# Patient Record
Sex: Male | Born: 1975 | Race: White | Hispanic: No | Marital: Married | Smoking: Never smoker
Health system: Southern US, Community
[De-identification: ages and names within clinical notes are randomized; demographics above are authoritative.]

## PROBLEM LIST (undated history)

## (undated) DIAGNOSIS — I10 Essential (primary) hypertension: Secondary | ICD-10-CM

---

## 2019-05-08 ENCOUNTER — Emergency Department (HOSPITAL_COMMUNITY): Payer: No Typology Code available for payment source

## 2019-05-08 ENCOUNTER — Other Ambulatory Visit: Payer: Self-pay

## 2019-05-08 ENCOUNTER — Observation Stay (HOSPITAL_COMMUNITY)
Admission: EM | Admit: 2019-05-08 | Discharge: 2019-05-10 | Disposition: A | Discharge status: Home / Self Care | Payer: No Typology Code available for payment source | Attending: Orthopaedic Surgery | Admitting: Orthopaedic Surgery

## 2019-05-08 ENCOUNTER — Emergency Department (HOSPITAL_COMMUNITY): Payer: No Typology Code available for payment source | Admitting: Certified Registered"

## 2019-05-08 ENCOUNTER — Encounter (HOSPITAL_COMMUNITY): Payer: Self-pay

## 2019-05-08 ENCOUNTER — Encounter (HOSPITAL_COMMUNITY)
Admission: EM | Disposition: A | Discharge status: Home / Self Care | Payer: Self-pay | Source: Home / Self Care | Attending: Emergency Medicine

## 2019-05-08 DIAGNOSIS — Z419 Encounter for procedure for purposes other than remedying health state, unspecified: Secondary | ICD-10-CM

## 2019-05-08 DIAGNOSIS — M25572 Pain in left ankle and joints of left foot: Secondary | ICD-10-CM | POA: Insufficient documentation

## 2019-05-08 DIAGNOSIS — S82872A Displaced pilon fracture of left tibia, initial encounter for closed fracture: Secondary | ICD-10-CM | POA: Diagnosis not present

## 2019-05-08 DIAGNOSIS — Z20828 Contact with and (suspected) exposure to other viral communicable diseases: Secondary | ICD-10-CM | POA: Diagnosis not present

## 2019-05-08 DIAGNOSIS — W1789XA Other fall from one level to another, initial encounter: Secondary | ICD-10-CM | POA: Diagnosis not present

## 2019-05-08 DIAGNOSIS — I10 Essential (primary) hypertension: Secondary | ICD-10-CM | POA: Diagnosis not present

## 2019-05-08 HISTORY — PX: EXTERNAL FIXATION LEG: SHX1549

## 2019-05-08 HISTORY — DX: Essential (primary) hypertension: I10

## 2019-05-08 LAB — CBC WITH DIFFERENTIAL/PLATELET
Abs Immature Granulocytes: 0.07 10*3/uL (ref 0.00–0.07)
Basophils Absolute: 0 10*3/uL (ref 0.0–0.1)
Basophils Relative: 0 %
Eosinophils Absolute: 0 10*3/uL (ref 0.0–0.5)
Eosinophils Relative: 0 %
HCT: 47.2 % (ref 39.0–52.0)
Hemoglobin: 16.5 g/dL (ref 13.0–17.0)
Immature Granulocytes: 1 %
Lymphocytes Relative: 10 %
Lymphs Abs: 1.2 10*3/uL (ref 0.7–4.0)
MCH: 28.9 pg (ref 26.0–34.0)
MCHC: 35 g/dL (ref 30.0–36.0)
MCV: 82.7 fL (ref 80.0–100.0)
Monocytes Absolute: 0.8 10*3/uL (ref 0.1–1.0)
Monocytes Relative: 6 %
Neutro Abs: 10.1 10*3/uL — ABNORMAL HIGH (ref 1.7–7.7)
Neutrophils Relative %: 83 %
Platelets: 204 10*3/uL (ref 150–400)
RBC: 5.71 MIL/uL (ref 4.22–5.81)
RDW: 14.1 % (ref 11.5–15.5)
WBC: 12.2 10*3/uL — ABNORMAL HIGH (ref 4.0–10.5)
nRBC: 0 % (ref 0.0–0.2)

## 2019-05-08 LAB — BASIC METABOLIC PANEL
Anion gap: 13 (ref 5–15)
BUN: 20 mg/dL (ref 6–20)
CO2: 21 mmol/L — ABNORMAL LOW (ref 22–32)
Calcium: 8.7 mg/dL — ABNORMAL LOW (ref 8.9–10.3)
Chloride: 104 mmol/L (ref 98–111)
Creatinine, Ser: 0.91 mg/dL (ref 0.61–1.24)
GFR calc Af Amer: >60 mL/min (ref >60)
GFR calc non Af Amer: >60 mL/min (ref >60)
Glucose, Bld: 112 mg/dL — ABNORMAL HIGH (ref 70–99)
Potassium: 3.5 mmol/L (ref 3.5–5.1)
Sodium: 138 mmol/L (ref 135–145)

## 2019-05-08 LAB — SARS CORONAVIRUS 2 BY RT PCR (HOSPITAL ORDER, PERFORMED IN ~~LOC~~ HOSPITAL LAB): SARS Coronavirus 2: NEGATIVE

## 2019-05-08 SURGERY — EXTERNAL FIXATION, LOWER EXTREMITY
Anesthesia: General | Site: Leg Lower | Laterality: Left

## 2019-05-08 MED ORDER — FENTANYL CITRATE (PF) 100 MCG/2ML IJ SOLN
INTRAMUSCULAR | Status: AC
  Filled 2019-05-08: qty 2

## 2019-05-08 MED ORDER — METOCLOPRAMIDE HCL 5 MG PO TABS: 5.0000 mg | ORAL_TABLET | Freq: Three times a day (TID) | ORAL | Status: DC | PRN

## 2019-05-08 MED ORDER — FENTANYL CITRATE (PF) 100 MCG/2ML IJ SOLN
25.0000 ug | INTRAMUSCULAR | Status: DC | PRN
  Administered 2019-05-08 (×2): 50 ug via INTRAVENOUS
  Administered 2019-05-08 (×2): 25 ug via INTRAVENOUS

## 2019-05-08 MED ORDER — MORPHINE SULFATE (PF) 4 MG/ML IV SOLN
6.0000 mg | Freq: Once | INTRAVENOUS | Status: AC
  Administered 2019-05-08: 6 mg via INTRAVENOUS
  Filled 2019-05-08: qty 2

## 2019-05-08 MED ORDER — ONDANSETRON HCL 4 MG/2ML IJ SOLN
INTRAMUSCULAR | Status: DC | PRN
  Administered 2019-05-08: 4 mg via INTRAVENOUS

## 2019-05-08 MED ORDER — ACETAMINOPHEN 325 MG PO TABS
325.0000 mg | ORAL_TABLET | Freq: Four times a day (QID) | ORAL | Status: DC | PRN
  Administered 2019-05-09: 650 mg via ORAL
  Filled 2019-05-08: qty 2

## 2019-05-08 MED ORDER — SUGAMMADEX SODIUM 500 MG/5ML IV SOLN
INTRAVENOUS | Status: DC | PRN
  Administered 2019-05-08: 250 mg via INTRAVENOUS

## 2019-05-08 MED ORDER — PANTOPRAZOLE SODIUM 40 MG PO TBEC
40.0000 mg | DELAYED_RELEASE_TABLET | Freq: Every day | ORAL | Status: DC
  Administered 2019-05-09 – 2019-05-10 (×2): 40 mg via ORAL
  Filled 2019-05-08 (×2): qty 1

## 2019-05-08 MED ORDER — CLINDAMYCIN PHOSPHATE 900 MG/50ML IV SOLN
INTRAVENOUS | Status: AC
  Filled 2019-05-08: qty 50

## 2019-05-08 MED ORDER — LACTATED RINGERS IV SOLN
INTRAVENOUS | Status: DC | PRN
  Administered 2019-05-08: 20:00:00 via INTRAVENOUS

## 2019-05-08 MED ORDER — LABETALOL HCL 5 MG/ML IV SOLN
INTRAVENOUS | Status: DC | PRN
  Administered 2019-05-08 (×2): 5 mg via INTRAVENOUS

## 2019-05-08 MED ORDER — FENTANYL CITRATE (PF) 100 MCG/2ML IJ SOLN
50.0000 ug | INTRAMUSCULAR | Status: DC | PRN
  Administered 2019-05-08: 23:00:00 50 ug via INTRAVENOUS

## 2019-05-08 MED ORDER — FENTANYL CITRATE (PF) 100 MCG/2ML IJ SOLN
100.0000 ug | Freq: Once | INTRAMUSCULAR | Status: AC
  Administered 2019-05-08: 100 ug via INTRAVENOUS
  Filled 2019-05-08: qty 2

## 2019-05-08 MED ORDER — SUCCINYLCHOLINE CHLORIDE 200 MG/10ML IV SOSY
PREFILLED_SYRINGE | INTRAVENOUS | Status: DC | PRN
  Administered 2019-05-08: 140 mg via INTRAVENOUS

## 2019-05-08 MED ORDER — KETOROLAC TROMETHAMINE 15 MG/ML IJ SOLN
15.0000 mg | Freq: Four times a day (QID) | INTRAMUSCULAR | Status: AC
  Administered 2019-05-08 – 2019-05-09 (×4): 15 mg via INTRAVENOUS
  Filled 2019-05-08 (×4): qty 1

## 2019-05-08 MED ORDER — METOCLOPRAMIDE HCL 5 MG/ML IJ SOLN
5.0000 mg | Freq: Three times a day (TID) | INTRAMUSCULAR | Status: DC | PRN
  Administered 2019-05-09: 5 mg via INTRAVENOUS
  Filled 2019-05-08 (×2): qty 2

## 2019-05-08 MED ORDER — ROCURONIUM BROMIDE 10 MG/ML (PF) SYRINGE
PREFILLED_SYRINGE | INTRAVENOUS | Status: DC | PRN
  Administered 2019-05-08: 5 mg via INTRAVENOUS
  Administered 2019-05-08: 45 mg via INTRAVENOUS
  Administered 2019-05-08: 10 mg via INTRAVENOUS

## 2019-05-08 MED ORDER — OXYCODONE HCL 5 MG PO TABS
5.0000 mg | ORAL_TABLET | ORAL | Status: DC | PRN
  Administered 2019-05-09 – 2019-05-10 (×4): 10 mg via ORAL
  Filled 2019-05-08 (×2): qty 2
  Filled 2019-05-08: qty 1
  Filled 2019-05-08 (×2): qty 2

## 2019-05-08 MED ORDER — ONDANSETRON HCL 4 MG PO TABS
4.0000 mg | ORAL_TABLET | Freq: Four times a day (QID) | ORAL | Status: DC | PRN
  Administered 2019-05-10: 13:00:00 4 mg via ORAL
  Filled 2019-05-08: qty 1

## 2019-05-08 MED ORDER — HYDRALAZINE HCL 20 MG/ML IJ SOLN
INTRAMUSCULAR | Status: AC
  Filled 2019-05-08: qty 1

## 2019-05-08 MED ORDER — METHOCARBAMOL 500 MG PO TABS
500.0000 mg | ORAL_TABLET | Freq: Four times a day (QID) | ORAL | Status: DC | PRN
  Administered 2019-05-09 – 2019-05-10 (×4): 500 mg via ORAL
  Filled 2019-05-08 (×4): qty 1

## 2019-05-08 MED ORDER — ONDANSETRON HCL 4 MG/2ML IJ SOLN
4.0000 mg | Freq: Four times a day (QID) | INTRAMUSCULAR | Status: DC | PRN
  Filled 2019-05-08: qty 2

## 2019-05-08 MED ORDER — FENTANYL CITRATE (PF) 100 MCG/2ML IJ SOLN
INTRAMUSCULAR | Status: DC | PRN
  Administered 2019-05-08: 50 ug via INTRAVENOUS
  Administered 2019-05-08 (×2): 100 ug via INTRAVENOUS

## 2019-05-08 MED ORDER — OXYCODONE HCL 5 MG PO TABS
10.0000 mg | ORAL_TABLET | ORAL | Status: DC | PRN
  Administered 2019-05-09 (×4): 10 mg via ORAL
  Administered 2019-05-10: 15 mg via ORAL
  Filled 2019-05-08 (×3): qty 2
  Filled 2019-05-08 (×2): qty 3

## 2019-05-08 MED ORDER — ONDANSETRON HCL 4 MG/2ML IJ SOLN
4.0000 mg | Freq: Once | INTRAMUSCULAR | Status: AC | PRN
  Administered 2019-05-08: 21:00:00 4 mg via INTRAVENOUS

## 2019-05-08 MED ORDER — DEXAMETHASONE SODIUM PHOSPHATE 10 MG/ML IJ SOLN
INTRAMUSCULAR | Status: DC | PRN
  Administered 2019-05-08: 10 mg via INTRAVENOUS

## 2019-05-08 MED ORDER — ONDANSETRON HCL 4 MG/2ML IJ SOLN
INTRAMUSCULAR | Status: AC
  Filled 2019-05-08: qty 2

## 2019-05-08 MED ORDER — SUCCINYLCHOLINE CHLORIDE 200 MG/10ML IV SOSY
PREFILLED_SYRINGE | INTRAVENOUS | Status: AC
  Filled 2019-05-08: qty 10

## 2019-05-08 MED ORDER — MIDAZOLAM HCL 2 MG/2ML IJ SOLN
INTRAMUSCULAR | Status: AC
  Filled 2019-05-08: qty 2

## 2019-05-08 MED ORDER — MORPHINE SULFATE (PF) 4 MG/ML IV SOLN
4.0000 mg | INTRAVENOUS | Status: DC | PRN
  Administered 2019-05-08 – 2019-05-10 (×7): 4 mg via INTRAVENOUS
  Filled 2019-05-08 (×8): qty 1

## 2019-05-08 MED ORDER — HYDRALAZINE HCL 20 MG/ML IJ SOLN
10.0000 mg | Freq: Four times a day (QID) | INTRAMUSCULAR | Status: DC | PRN
  Administered 2019-05-08: 10 mg via INTRAVENOUS

## 2019-05-08 MED ORDER — DEXAMETHASONE SODIUM PHOSPHATE 10 MG/ML IJ SOLN
INTRAMUSCULAR | Status: AC
  Filled 2019-05-08: qty 1

## 2019-05-08 MED ORDER — PROPOFOL 10 MG/ML IV BOLUS
INTRAVENOUS | Status: AC
  Filled 2019-05-08: qty 20

## 2019-05-08 MED ORDER — ROCURONIUM BROMIDE 10 MG/ML (PF) SYRINGE
PREFILLED_SYRINGE | INTRAVENOUS | Status: AC
  Filled 2019-05-08: qty 10

## 2019-05-08 MED ORDER — METHOCARBAMOL 500 MG IVPB - SIMPLE MED
500.0000 mg | Freq: Four times a day (QID) | INTRAVENOUS | Status: DC | PRN
  Administered 2019-05-08: 500 mg via INTRAVENOUS
  Filled 2019-05-08: qty 50

## 2019-05-08 MED ORDER — SODIUM CHLORIDE 0.9 % IV SOLN
INTRAVENOUS | Status: DC
  Administered 2019-05-08 – 2019-05-09 (×2): via INTRAVENOUS

## 2019-05-08 MED ORDER — DOCUSATE SODIUM 100 MG PO CAPS
100.0000 mg | ORAL_CAPSULE | Freq: Two times a day (BID) | ORAL | Status: DC
  Administered 2019-05-08 – 2019-05-10 (×4): 100 mg via ORAL
  Filled 2019-05-08 (×4): qty 1

## 2019-05-08 MED ORDER — PROPOFOL 10 MG/ML IV BOLUS
INTRAVENOUS | Status: DC | PRN
  Administered 2019-05-08: 200 mg via INTRAVENOUS

## 2019-05-08 MED ORDER — POLYETHYLENE GLYCOL 3350 17 G PO PACK: 17.0000 g | PACK | Freq: Every day | ORAL | Status: DC | PRN

## 2019-05-08 MED ORDER — FENTANYL CITRATE (PF) 250 MCG/5ML IJ SOLN
INTRAMUSCULAR | Status: AC
  Filled 2019-05-08: qty 5

## 2019-05-08 MED ORDER — MIDAZOLAM HCL 5 MG/5ML IJ SOLN
INTRAMUSCULAR | Status: DC | PRN
  Administered 2019-05-08: 2 mg via INTRAVENOUS

## 2019-05-08 MED ORDER — METHOCARBAMOL 500 MG IVPB - SIMPLE MED
INTRAVENOUS | Status: AC
  Filled 2019-05-08: qty 50

## 2019-05-08 MED ORDER — BUPIVACAINE HCL (PF) 0.5 % IJ SOLN
INTRAMUSCULAR | Status: AC
  Filled 2019-05-08: qty 30

## 2019-05-08 MED ORDER — CLINDAMYCIN PHOSPHATE 900 MG/50ML IV SOLN
INTRAVENOUS | Status: DC | PRN
  Administered 2019-05-08: 900 mg via INTRAVENOUS

## 2019-05-08 MED ORDER — GABAPENTIN 100 MG PO CAPS
100.0000 mg | ORAL_CAPSULE | Freq: Three times a day (TID) | ORAL | Status: DC
  Administered 2019-05-08 – 2019-05-10 (×5): 100 mg via ORAL
  Filled 2019-05-08 (×5): qty 1

## 2019-05-08 MED ORDER — SUGAMMADEX SODIUM 500 MG/5ML IV SOLN
INTRAVENOUS | Status: AC
  Filled 2019-05-08: qty 5

## 2019-05-08 MED ORDER — DIPHENHYDRAMINE HCL 12.5 MG/5ML PO ELIX
12.5000 mg | ORAL_SOLUTION | ORAL | Status: DC | PRN
  Administered 2019-05-09 (×2): 12.5 mg via ORAL
  Administered 2019-05-09: 25 mg via ORAL
  Filled 2019-05-08 (×2): qty 5
  Filled 2019-05-08: qty 10

## 2019-05-08 MED ORDER — SODIUM CHLORIDE 0.9 % IV SOLN
INTRAVENOUS | Status: AC
  Filled 2019-05-08: qty 500000

## 2019-05-08 MED ORDER — LIDOCAINE 2% (20 MG/ML) 5 ML SYRINGE
INTRAMUSCULAR | Status: DC | PRN
  Administered 2019-05-08: 100 mg via INTRAVENOUS

## 2019-05-08 SURGICAL SUPPLY — 37 items
BAG ZIPLOCK 12X15 (MISCELLANEOUS) ×3 IMPLANT
BANDAGE ESMARK 6X9 LF (GAUZE/BANDAGES/DRESSINGS) ×1 IMPLANT
BAR GLASS FIBER EXFX 11X400 (EXFIX) ×6 IMPLANT
BNDG ELASTIC 4X5.8 VLCR STR LF (GAUZE/BANDAGES/DRESSINGS) ×6 IMPLANT
BNDG ESMARK 6X9 LF (GAUZE/BANDAGES/DRESSINGS) ×3
BNDG GAUZE ELAST 4 BULKY (GAUZE/BANDAGES/DRESSINGS) ×3 IMPLANT
CLAMP BLUE BAR TO PIN (EXFIX) ×6 IMPLANT
COVER SURGICAL LIGHT HANDLE (MISCELLANEOUS) ×3 IMPLANT
COVER WAND RF STERILE (DRAPES) IMPLANT
CUFF TOURN SGL QUICK 18X4 (TOURNIQUET CUFF) IMPLANT
CUFF TOURN SGL QUICK 24 (TOURNIQUET CUFF)
CUFF TOURN SGL QUICK 34 (TOURNIQUET CUFF)
CUFF TRNQT CYL 24X4X16.5-23 (TOURNIQUET CUFF) IMPLANT
CUFF TRNQT CYL 34X4.125X (TOURNIQUET CUFF) IMPLANT
DRAPE C-ARM 42X120 X-RAY (DRAPES) ×3 IMPLANT
DRAPE C-ARMOR (DRAPES) ×3 IMPLANT
DRSG CURAD 3X16 NADH (PACKING) ×3 IMPLANT
DRSG PAD ABDOMINAL 8X10 ST (GAUZE/BANDAGES/DRESSINGS) ×6 IMPLANT
DURAPREP 26ML APPLICATOR (WOUND CARE) ×3 IMPLANT
ELECT REM PT RETURN 15FT ADLT (MISCELLANEOUS) ×3 IMPLANT
GAUZE SPONGE 4X4 12PLY STRL (GAUZE/BANDAGES/DRESSINGS) ×3 IMPLANT
GLOVE BIO SURGEON STRL SZ7.5 (GLOVE) ×3 IMPLANT
GLOVE BIOGEL PI IND STRL 8 (GLOVE) ×1 IMPLANT
GLOVE BIOGEL PI INDICATOR 8 (GLOVE) ×2
GLOVE ECLIPSE 8.0 STRL XLNG CF (GLOVE) ×3 IMPLANT
GOWN STRL REUS W/TWL XL LVL3 (GOWN DISPOSABLE) ×3 IMPLANT
HALF PIN 5.0X160 (EXFIX) ×6 IMPLANT
KIT BASIN OR (CUSTOM PROCEDURE TRAY) ×3 IMPLANT
KIT TURNOVER KIT A (KITS) IMPLANT
PACK ORTHO EXTREMITY (CUSTOM PROCEDURE TRAY) ×3 IMPLANT
PAD CAST 4YDX4 CTTN HI CHSV (CAST SUPPLIES) ×1 IMPLANT
PADDING CAST COTTON 4X4 STRL (CAST SUPPLIES) ×2
PIN CLAMP 2BAR 75MM BLUE (EXFIX) ×3 IMPLANT
PIN TRANSFIXING 5.0 (EXFIX) ×3 IMPLANT
PROTECTOR NERVE ULNAR (MISCELLANEOUS) ×3 IMPLANT
SYR CONTROL 10ML LL (SYRINGE) ×3 IMPLANT
TOWEL OR 17X26 10 PK STRL BLUE (TOWEL DISPOSABLE) ×6 IMPLANT

## 2019-05-08 NOTE — H&P (Signed)
Jason Russo is an 43 y.o. male.   Chief Complaint: Left ankle pilon fracture Taking surgery later on today job and lives all the way over and wait for his neurology and worried about being all the way of hearing having surgery but I explained to him that definitive surgery can be done over there is night in the hospital and that he has over there HPI: Patient is a 43 year old male who was at work today time down the road [place] on the back of [employer] flatbed truck when he lost his footing and fell off injuring his left ankle.  He denies any loss of consciousness dizziness.  He did come down on his right heel 2.  But his main complaint today is the left ankle.  Is unable to bear weight on the ankle.  His brother was in our ER where he was found to have a left ankle pilon fracture.  Past Medical History:  Diagnosis Date  . Hypertension     History reviewed. No pertinent surgical history.  No family history on file. Social History:  reports that he has never smoked. His smokeless tobacco use includes chew. He reports current alcohol use. He reports that he does not use drugs.  Allergies:  Allergies  Allergen Reactions  . Hydromorphone Anaphylaxis  . Cefdinir     Other reaction(s): Other, Other (See Comments) Other Reaction: ARTHRALGIA Other Reaction: ARTHRALGIA   . Dilaudid [Hydromorphone Hcl] Other (See Comments)    Low HR  . Sertraline Hcl     Other reaction(s): Other Suicidal thoughts    (Not in a hospital admission)   Results for orders placed or performed during the hospital encounter of 05/08/19 (from the past 48 hour(s))  CBC with Differential/Platelet     Status: Abnormal   Collection Time: 05/08/19  4:23 PM  Result Value Ref Range   WBC 12.2 (H) 4.0 - 10.5 K/uL   RBC 5.71 4.22 - 5.81 MIL/uL   Hemoglobin 16.5 13.0 - 17.0 g/dL   HCT 47.2 39.0 - 52.0 %   MCV 82.7 80.0 - 100.0 fL   MCH 28.9 26.0 - 34.0 pg   MCHC 35.0 30.0 - 36.0 g/dL   RDW 14.1 11.5 - 15.5 %   Platelets 204 150 - 400 K/uL   nRBC 0.0 0.0 - 0.2 %   Neutrophils Relative % 83 %   Neutro Abs 10.1 (H) 1.7 - 7.7 K/uL   Lymphocytes Relative 10 %   Lymphs Abs 1.2 0.7 - 4.0 K/uL   Monocytes Relative 6 %   Monocytes Absolute 0.8 0.1 - 1.0 K/uL   Eosinophils Relative 0 %   Eosinophils Absolute 0.0 0.0 - 0.5 K/uL   Basophils Relative 0 %   Basophils Absolute 0.0 0.0 - 0.1 K/uL   Immature Granulocytes 1 %   Abs Immature Granulocytes 0.07 0.00 - 0.07 K/uL    Comment: Performed at [facility], [place] [address] [place], [place] [address]  Basic metabolic panel     Status: Abnormal   Collection Time: 05/08/19  4:23 PM  Result Value Ref Range   Sodium 138 135 - 145 mmol/L   Potassium 3.5 3.5 - 5.1 mmol/L   Chloride 104 98 - 111 mmol/L   CO2 21 (L) 22 - 32 mmol/L   Glucose, Bld 112 (H) 70 - 99 mg/dL   BUN 20 6 - 20 mg/dL   Creatinine, Ser 0.91 0.61 - 1.24 mg/dL   Calcium 8.7 (L) 8.9 -  10.3 mg/dL   GFR calc non Af Amer >60 >60 mL/min   GFR calc Af Amer >60 >60 mL/min   Anion gap 13 5 - 15    Comment: Performed at [facility], [address], [place], [place] [address]   Dg Tibia/fibula Left  Result Date: 05/08/2019 CLINICAL DATA:  Fall. Left ankle and foot pain and swelling. EXAM: LEFT TIBIA AND FIBULA - 2 VIEW COMPARISON:  None. FINDINGS: There is a comminuted, mildly displaced fracture of the distal tibia extending from the distal tibia shaft to the tibial plafond and through the base of the medial malleolus. No fracture is identified involving the more proximal tibia or fibula. The knee is located. There is diffuse soft tissue swelling about the ankle. IMPRESSION: Comminuted, intra-articular distal tibia fracture. Electronically Signed   By: Sebastian Ache M.D.   On: 05/08/2019 15:41   Dg Ankle Complete Left  Result Date: 05/08/2019 CLINICAL DATA:  Fall. Left ankle and foot pain and swelling. EXAM: LEFT ANKLE COMPLETE - 3+ VIEW COMPARISON:  None.  FINDINGS: There is a comminuted, mildly displaced fracture of the distal tibia extending from the distal tibia shaft to the tibial plafond and through the base of the medial malleolus. There is widening of the ankle joint laterally. The distal fibula appears intact. There is diffuse soft tissue swelling about the ankle. IMPRESSION: Comminuted, intra-articular distal tibia fracture with widening of the ankle joint. Electronically Signed   By: Sebastian Ache M.D.   On: 05/08/2019 15:40   Dg Foot Complete Left  Result Date: 05/08/2019 CLINICAL DATA:  Fall. Left ankle and foot pain and swelling. EXAM: LEFT FOOT - COMPLETE 3+ VIEW COMPARISON:  None. FINDINGS: No acute fracture or dislocation is identified in the foot. The distal tibia fracture is reported separately. There is a moderate-sized posterior calcaneal enthesophyte. IMPRESSION: No acute osseous abnormality in the foot. Electronically Signed   By: Sebastian Ache M.D.   On: 05/08/2019 15:37    Review of Systems  Constitutional: Negative for chills and fever.  Respiratory: Negative for shortness of breath.   Cardiovascular: Negative for chest pain.  Neurological: Negative for dizziness and loss of consciousness.  Lower extremities good range of motion bilateral hips without pain.  Right thigh lower leg nontender.  Right heel minimal tenderness.  Compartments both calves are soft and nontender bilaterally.  Edema involving the left ankle with tenderness over the medial malleolus obvious deformity.  Flexion extension bilateral toes intact.  Sensation intact bilateral feet to light touch.  Blood pressure (!) 172/109, temperature 97.6 F (36.4 C), temperature source Oral, resp. rate 20, SpO2 100 %. Physical Exam  Constitutional: He is oriented to person, place, and time. He appears well-developed and well-nourished. No distress.  HENT:  Head: Normocephalic and atraumatic.  Eyes: EOM are normal.  Cardiovascular: Intact distal pulses.  Respiratory:  Effort normal. No respiratory distress.  Neurological: He is alert and oriented to person, place, and time.  Skin: Skin is warm and dry.  Psychiatric: He has a normal mood and affect.     Assessment/Plan Left ankle pilon fracture: Reviewed the films with the patient discussing with him that he needs to have surgery to stabilize the fracture and bring it out to length.  However he would also require second definitive surgery after undergoing the initial surgery which would be a close reduction with external fixation of the left Pilon fracture.  Risk benefits of surgery discussed with patient at length.   Plan  to proceed with closed reduction left Pilon fracture with placement of an external fixator later today.  Tobacco cessation discussed with patient.  NPO  Nonweightbearing left lower extremity.   Richardean CanalGILBERT Jesselle Laflamme, PA-C 05/08/2019, 5:14 PM

## 2019-05-08 NOTE — ED Triage Notes (Signed)
Pt arrived via EMS from work , pt was standing on flat bed, and step backwards and fell backwards landing on his feet and now presents with Left foot pain no LOC, no head injuries, with obvious deformities, and swelling. Pt was given 200 mcg of fentanyl , 4 mg of Zofran enroute. Pt has 20 G left hand .

## 2019-05-08 NOTE — ED Notes (Signed)
Patient is upset due to wife not being able to come back to visit-patient is preparing for surgery and needs to be tested for Covid-attempted to explained to patient she could not come back until procedure was completed-patient would not allow writer to explain and proceeded to call me "rude"-relayed episode to primary RN

## 2019-05-08 NOTE — Brief Op Note (Signed)
05/08/2019  8:39 PM  PATIENT:  Jason Russo  43 y.o. male  PRE-OPERATIVE DIAGNOSIS:  LEFT ANKLE PILON FRACTURE  POST-OPERATIVE DIAGNOSIS:  LEFT ANKLE PILON FRACTURE  PROCEDURE:  Procedure(s): EXTERNAL FIXATION LEG (Left)  SURGEON:  Surgeon(s) and Role:    Mcarthur Rossetti, MD - Primary  PHYSICIAN ASSISTANT:  Benita Stabile, PA-C  ANESTHESIA:   general   EBL:  minimal  COUNTS:  YES  TOURNIQUET:  * No tourniquets in log *  DICTATION: .Other Dictation: Dictation Number [id number]  PLAN OF CARE: Admit for overnight observation  PATIENT DISPOSITION:  PACU - hemodynamically stable.   Delay start of Pharmacological VTE agent (>24hrs) due to surgical blood loss or risk of bleeding: no

## 2019-05-08 NOTE — Transfer of Care (Signed)
Immediate Anesthesia Transfer of Care Note  Patient: Jason Russo  Procedure(s) Performed: EXTERNAL FIXATION LEG (Left Leg Lower)  Patient Location: PACU  Anesthesia Type:General  Level of Consciousness: awake, alert  and oriented  Airway & Oxygen Therapy: Patient Spontanous Breathing and Patient connected to face mask oxygen  Post-op Assessment: Report given to RN and Post -op Vital signs reviewed and stable  Post vital signs: Reviewed and stable  Last Vitals:  Vitals Value Taken Time  BP 175/99 05/08/19 2047  Temp    Pulse 76 05/08/19 2049  Resp 20 05/08/19 2049  SpO2 100 % 05/08/19 2049  Vitals shown include unvalidated device data.  Last Pain:  Vitals:   05/08/19 1531  TempSrc:   PainSc: 8       Patients Stated Pain Goal: 2 (19/41/74 0814)  Complications: No apparent anesthesia complications

## 2019-05-08 NOTE — Anesthesia Procedure Notes (Addendum)
Procedure Name: Intubation Date/Time: 05/08/2019 7:51 PM Performed by: Keela Rubert D, CRNA Pre-anesthesia Checklist: Patient identified, Emergency Drugs available, Suction available and Patient being monitored Patient Re-evaluated:Patient Re-evaluated prior to induction Oxygen Delivery Method: Circle system utilized Preoxygenation: Pre-oxygenation with 100% oxygen Induction Type: IV induction Ventilation: Mask ventilation without difficulty Laryngoscope Size: Mac and 4 Grade View: Grade I Tube type: Oral Tube size: 7.5 mm Number of attempts: 1 Airway Equipment and Method: Stylet and Oral airway Placement Confirmation: ETT inserted through vocal cords under direct vision,  positive ETCO2 and breath sounds checked- equal and bilateral Secured at: 22 cm Tube secured with: Tape Dental Injury: Teeth and Oropharynx as per pre-operative assessment

## 2019-05-08 NOTE — ED Notes (Signed)
Pt refused COVID swab. Pt states he is 2 hours away from home and prefers to be at a hospital closer to home if he is going to have surgery.

## 2019-05-08 NOTE — ED Provider Notes (Signed)
[place] DEPT Provider Note   CSN: [id number] Arrival date & time: 05/08/19  1332     History   Chief Complaint Chief Complaint  Patient presents with  . Fall  . Ankle Pain    HPI Jason Russo is a 43 y.o. male.     HPI Patient states that he jumped off the bed of a truck roughly 4 feet off the ground.  Came down on his left foot and had immediate pain of the foot and ankle rating up the left lateral lower leg.  Denies hitting his head or loss of consciousness.  Denies numbness.  Placed in splint and given pain medication by EMS en route.  Patient states he has not eaten today. Past Medical History:  Diagnosis Date  . Hypertension     There are no active problems to display for this patient.   History reviewed. No pertinent surgical history.      Home Medications    Prior to Admission medications   Medication Sig Start Date End Date Taking? Authorizing Provider  ibuprofen (ADVIL) 200 MG tablet Take 800 mg by mouth daily as needed for headache or moderate pain.   Yes [provider]    Family History No family history on file.  Social History Social History   Tobacco Use  . Smoking status: Never Smoker  . Smokeless tobacco: Current User    Types: Chew  Substance Use Topics  . Alcohol use: Yes  . Drug use: Never     Allergies   Hydromorphone, Cefdinir, Dilaudid [hydromorphone hcl], and Sertraline hcl   Review of Systems Review of Systems  Musculoskeletal: Positive for arthralgias and joint swelling.  Skin: Negative for rash and wound.  Neurological: Negative for syncope, weakness, numbness and headaches.  All other systems reviewed and are negative.    Physical Exam Updated Vital Signs BP (!) 172/109 (BP Location: Left Arm)   Temp 97.6 F (36.4 C) (Oral)   Resp 20   SpO2 100%   Physical Exam Vitals signs and nursing note reviewed.  Constitutional:      Appearance: Normal appearance. He is  well-developed.  HENT:     Head: Normocephalic and atraumatic.  Eyes:     Pupils: Pupils are equal, round, and reactive to light.  Neck:     Musculoskeletal: Normal range of motion and neck supple.  Cardiovascular:     Rate and Rhythm: Normal rate.  Pulmonary:     Effort: Pulmonary effort is normal.     Breath sounds: Normal breath sounds.  Abdominal:     Palpations: Abdomen is soft.  Musculoskeletal:        General: Swelling and tenderness present.     Comments: Patient with tenderness palpation over the lateral dorsum of the left foot.  Also has tenderness palpation diffusely over the left ankle which appears to be swollen.  Tenderness continues up the lateral surface of the left lower leg to the mid tibial region.  Patient has no proximal fibular or left knee tenderness.  Has full range of motion of the knee.  No obvious effusion or deformity.  Skin:    General: Skin is warm and dry.     Findings: No erythema or rash.  Neurological:     Mental Status: He is alert and oriented to person, place, and time.     Comments: Wiggling toes freely of the left foot.  Restriction of movement of the left ankle due to pain.  Sensation  to light touch intact.  Psychiatric:        Behavior: Behavior normal.      ED Treatments / Results  Labs (all labs ordered are listed, but only abnormal results are displayed) Labs Reviewed  CBC WITH DIFFERENTIAL/PLATELET - Abnormal; Notable for the following components:      Result Value   WBC 12.2 (*)    Neutro Abs 10.1 (*)    All other components within normal limits  BASIC METABOLIC PANEL - Abnormal; Notable for the following components:   CO2 21 (*)    Glucose, Bld 112 (*)    Calcium 8.7 (*)    All other components within normal limits  SARS CORONAVIRUS 2 (HOSPITAL ORDER[facility])    EKG None  Radiology Dg Tibia/fibula Left  Result Date: 05/08/2019 CLINICAL DATA:  Fall. Left ankle and foot pain and swelling.  EXAM: LEFT TIBIA AND FIBULA - 2 VIEW COMPARISON:  None. FINDINGS: There is a comminuted, mildly displaced fracture of the distal tibia extending from the distal tibia shaft to the tibial plafond and through the base of the medial malleolus. No fracture is identified involving the more proximal tibia or fibula. The knee is located. There is diffuse soft tissue swelling about the ankle. IMPRESSION: Comminuted, intra-articular distal tibia fracture. Electronically Signed   By: Sebastian AcheAllen  Grady M.D.   On: 05/08/2019 15:41   Dg Ankle Complete Left  Result Date: 05/08/2019 CLINICAL DATA:  Fall. Left ankle and foot pain and swelling. EXAM: LEFT ANKLE COMPLETE - 3+ VIEW COMPARISON:  None. FINDINGS: There is a comminuted, mildly displaced fracture of the distal tibia extending from the distal tibia shaft to the tibial plafond and through the base of the medial malleolus. There is widening of the ankle joint laterally. The distal fibula appears intact. There is diffuse soft tissue swelling about the ankle. IMPRESSION: Comminuted, intra-articular distal tibia fracture with widening of the ankle joint. Electronically Signed   By: Sebastian AcheAllen  Grady M.D.   On: 05/08/2019 15:40   Dg Foot Complete Left  Result Date: 05/08/2019 CLINICAL DATA:  Fall. Left ankle and foot pain and swelling. EXAM: LEFT FOOT - COMPLETE 3+ VIEW COMPARISON:  None. FINDINGS: No acute fracture or dislocation is identified in the foot. The distal tibia fracture is reported separately. There is a moderate-sized posterior calcaneal enthesophyte. IMPRESSION: No acute osseous abnormality in the foot. Electronically Signed   By: Sebastian AcheAllen  Grady M.D.   On: 05/08/2019 15:37    Procedures Procedures (including critical care time)  Medications Ordered in ED Medications  clindamycin (CLEOCIN) 900 MG/50ML IVPB (has no administration in time range)  fentaNYL (SUBLIMAZE) injection 100 mcg (100 mcg Intravenous Given 05/08/19 1448)  fentaNYL (SUBLIMAZE) injection 100  mcg (100 mcg Intravenous Given 05/08/19 1617)  morphine 4 MG/ML injection 6 mg (6 mg Intravenous Given 05/08/19 1747)  dexamethasone (DECADRON) 10 MG/ML injection (has no administration in time range)  ondansetron (ZOFRAN) 4 MG/2ML injection (has no administration in time range)  midazolam (VERSED) 2 MG/2ML injection (has no administration in time range)  fentaNYL (SUBLIMAZE) 250 MCG/5ML injection (has no administration in time range)  propofol (DIPRIVAN) 10 mg/mL bolus/IV push (has no administration in time range)     Initial Impression / Assessment and Plan / ED Course  I have reviewed the triage vital signs and the nursing notes.  Pertinent labs & imaging results that were available during my care of the patient were reviewed by me and considered in  my medical decision making (see chart for details).        Discussed x-ray findings with Dr. Magnus Ivan.  Advises keeping foot elevated and ice.  Will likely need operative fixation today.  Final Clinical Impressions(s) / ED Diagnoses   Final diagnoses:  Closed displaced pilon fracture of left tibia, initial encounter    ED Discharge Orders    None       Loren Racer, MD 05/08/19 [place]

## 2019-05-08 NOTE — ED Notes (Signed)
Patient transported to X-ray 

## 2019-05-09 ENCOUNTER — Observation Stay (HOSPITAL_COMMUNITY): Payer: No Typology Code available for payment source

## 2019-05-09 MED ORDER — OXYCODONE HCL 5 MG PO TABS: 5.0000 mg | ORAL_TABLET | ORAL | 0 refills | Status: AC | PRN

## 2019-05-09 MED ORDER — ASPIRIN EC 325 MG PO TBEC: 325.0000 mg | DELAYED_RELEASE_TABLET | Freq: Two times a day (BID) | ORAL | 0 refills | Status: AC

## 2019-05-09 MED ORDER — TEMAZEPAM 7.5 MG PO CAPS: 7.5000 mg | ORAL_CAPSULE | Freq: Every evening | ORAL | 0 refills | Status: AC | PRN

## 2019-05-09 MED ORDER — METHOCARBAMOL 500 MG PO TABS: 500.0000 mg | ORAL_TABLET | Freq: Four times a day (QID) | ORAL | 1 refills | Status: AC | PRN

## 2019-05-09 NOTE — Evaluation (Signed)
Physical Therapy Evaluation Patient Details Name: Jason Russo MRN: [id number] DOB: [birth year] Today's Date: 05/09/2019   History of Present Illness  43 YO male sustained left pilon fracture after stepping off of a flatbed truck. S/P external fixture 05/08/19.  Clinical Impression  The patient  Mobilized with RW in room. Discussed recommendations for travel home, sitting sideways in back seat of vehicle. Also reviewed use of 3 in 1 for shower although most likely will not be able to shower, defer to MD. Will obtain crutches for use at a later time or to practice steps. Today pt stated: My son will get me up the steps. Will address steps again next visit. Pt admitted with above diagnosis. Pt currently with functional limitations due to the deficits listed below (see PT Problem List). Pt will benefit from skilled PT to increase their independence and safety with mobility to allow discharge to the venue listed below.       Follow Up Recommendations Home health PT    Equipment Recommendations  Rolling walker with 5" wheels;Crutches;3in1 (PT)    Recommendations for Other Services       Precautions / Restrictions Precautions Precautions: Fall Restrictions Weight Bearing Restrictions: Yes      Mobility  Bed Mobility Overal bed mobility: Modified Independent                Transfers Overall transfer level: Needs assistance Equipment used: Rolling walker (2 wheeled) Transfers: Sit to/from Stand Sit to Stand: Min guard         General transfer comment: cues  for hand placement, hit the ex fix on bed rail several times  Ambulation/Gait Ambulation/Gait assistance: Min guard Gait Distance (Feet): 20 Feet(x 2) Assistive device: Rolling walker (2 wheeled) Gait Pattern/deviations: Step-to pattern Gait velocity: decr   General Gait Details: pt. able to balance and m,aintain NWB on LLE  Stairs            Wheelchair Mobility    Modified Rankin (Stroke Patients  Only)       Balance Overall balance assessment: Needs assistance   Sitting balance-Leahy Scale: Normal     Standing balance support: Bilateral upper extremity supported;During functional activity Standing balance-Leahy Scale: Fair                               Pertinent Vitals/Pain Pain Assessment: 0-10 Pain Score: 8  Pain Descriptors / Indicators: Aching;Sharp Pain Intervention(s): Patient requesting pain meds-RN notified;Ice applied    Home Living Family/patient expects to be discharged to:: Private residence Living Arrangements: Spouse/significant other;Children Available Help at Discharge: Family Type of Home: House Home Access: Stairs to enter Entrance Stairs-Rails: Psychiatric nurse of Steps: 2 Home Layout: One level Home Equipment: None      Prior Function                 Hand Dominance        Extremity/Trunk Assessment   Upper Extremity Assessment Upper Extremity Assessment: Overall WFL for tasks assessed    Lower Extremity Assessment LLE Deficits / Details: ex fixator       Communication      Cognition Arousal/Alertness: Awake/alert Behavior During Therapy: WFL for tasks assessed/performed Overall Cognitive Status: Within Functional Limits for tasks assessed  General Comments      Exercises     Assessment/Plan    PT Assessment Patient needs continued PT services  PT Problem List Decreased strength;Decreased mobility;Decreased safety awareness;Decreased knowledge of precautions;Decreased activity tolerance;Pain;Decreased knowledge of use of DME       PT Treatment Interventions DME instruction;Therapeutic activities;Gait training;Stair training;Functional mobility training;Patient/family education    PT Goals (Current goals can be found in the Care Plan section)  Acute Rehab PT Goals Patient Stated Goal: to go home PT Goal Formulation: With  patient/family Time For Goal Achievement: 05/16/19 Potential to Achieve Goals: Good    Frequency Min 6X/week   Barriers to discharge        Co-evaluation               AM-PAC PT "6 Clicks" Mobility  Outcome Measure Help needed turning from your back to your side while in a flat bed without using bedrails?: None Help needed moving from lying on your back to sitting on the side of a flat bed without using bedrails?: None Help needed moving to and from a bed to a chair (including a wheelchair)?: A Little Help needed standing up from a chair using your arms (e.g., wheelchair or bedside chair)?: A Little Help needed to walk in hospital room?: A Little Help needed climbing 3-5 steps with a railing? : A Lot 6 Click Score: 19    End of Session   Activity Tolerance: Patient limited by pain Patient left: in bed;with call bell/phone within reach;with family/visitor present Nurse Communication: Mobility status PT Visit Diagnosis: Unsteadiness on feet (R26.81);Pain Pain - Right/Left: Left Pain - part of body: Leg    Time: 1114-1200 PT Time Calculation (min) (ACUTE ONLY): 46 min   Charges:   PT Evaluation $PT Eval Low Complexity: 1 Low PT Treatments $Gait Training: 8-22 mins $Self Care/Home Management: 8-22        Blanchard KelchKaren Christinamarie Tall PT Acute Rehabilitation Services Pager [phone] Office [phone]   Rada HayHill, Amayra Kiedrowski Elizabeth 05/09/2019, 12:30 PM

## 2019-05-09 NOTE — Op Note (Signed)
NAME: KRUZE, ATCHLEY MEDICAL RECORD XN:[id number] ACCOUNT [id number] DATE OF BIRTH:[birth year] FACILITY: [facility] LOCATION: [facility] PHYSICIAN:CHRISTOPHER Kerry Fort, MD  OPERATIVE REPORT  DATE OF PROCEDURE:  05/08/2019  PREOPERATIVE DIAGNOSIS:  Left distal tibia pilon fracture.  POSTOPERATIVE DIAGNOSIS:  Left distal tibia pilon fracture.  PROCEDURE:  External fixation of left pilon fracture spanning the left ankle joint.  IMPLANTS:  Biomet-Zimmer external fixation.  SURGEON:  Lind Guest. Ninfa Linden, MD  ASSISTANT:  Erskine Emery, PA-C  ANESTHESIA:  General.  ESTIMATED BLOOD LOSS:  Minimal.  ANTIBIOTICS:  900 mg IV clindamycin.  COMPLICATIONS:  None.  INDICATIONS:  The patient is a very pleasant 43 year old gentleman who had an accidental fall from a height off of the back of a truck that he was working involved in a work-related accident.  He landed hard on his left ankle and had immediate pain and  inability to weightbear.  He is actually from the [place] part of [place].  He was in [place], [place], during this.  He was transported to the [facility] Emergency Room and found to have a displaced intra-articular left pilon fracture  that was quite significant.  For an injury such as this, we have recommended external fixation to get his ankle out to a better resting position and to take pressure off the soft tissues and allow for ligamentotaxis.  He understands we will then be  admitting him for a CT scan to assess the articular surface.  He also understands that definitive treatment of this fracture will need to be in 1-2 weeks once the soft tissue swelling has subsided.  He will likely seek further treatment near his part of  the state.  We explained in detail our rationale behind proceeding with surgery and the risks and benefits involved and he did wish to proceed.  DESCRIPTION OF PROCEDURE:  After informed consent was obtained and appropriate left ankle was  marked.  He was brought to the operating room and placed supine on the operating table.  General anesthesia was then obtained.  His left knee, leg, foot and  ankle were prescrubbed and then prepped and draped with DuraPrep and sterile drapes.  A time-out was called.  He was identified, correct patient, correct left ankle.  We then assessed where the fracture was radiographically, so we could make markers were  marked around his leg, so we could have our fixation pins from the external fixator placed appropriately at an adequate site distal to the fracture.  We placed 2 external fixation pins from anterior to posterior in the tibia proximal to the fracture.   These were from anterior to posterior.  We then placed a calcaneus pin from medial to lateral.  The pin placement was performed under direct fluoroscopy.  We then hooked up our delta frame to be able to pull the fracture out to length with appropriate  positioning again under direct fluoroscopic guidance.  Once we felt that we had good position of the fracture with also taking pressure off the soft tissues, we were able to lock the external fixation in place.  We then placed a well-padded sterile  dressing around the external fixation pin sites.  The patient was awakened, extubated, and taken to recovery room in stable condition.  All final counts were correct.  There were no complications noted.  TN/NUANCE  D:05/08/2019 T:05/09/2019 JOB:[id number]

## 2019-05-09 NOTE — Progress Notes (Signed)
Subjective: 1 Day Post-Op Procedure(s) (LRB): EXTERNAL FIXATION LEG (Left) Patient reports pain as moderate to severe.  Otherwise no complaints.   Objective: Vital signs in last 24 hours: Temp:  [97.6 F (36.4 C)-98.3 F (36.8 C)] 98 F (36.7 C) (09/19 0542) Pulse Rate:  [59-88] 71 (09/19 0542) Resp:  [11-22] 18 (09/19 0542) BP: (141-196)/(86-117) 141/98 (09/19 0542) SpO2:  [88 %-100 %] 95 % (09/19 0542) Weight:  [129.3 kg] 129.3 kg (09/18 2300)  Intake/Output from previous day: 09/18 0701 - 09/19 0700 In: 2036.2 [I.V.:1336.2; IV Piggyback:100] Out: 750 [Urine:750] Intake/Output this shift: No intake/output data recorded.  Recent Labs    05/08/19 1623  HGB 16.5   Recent Labs    05/08/19 1623  WBC 12.2*  RBC 5.71  HCT 47.2  PLT 204   Recent Labs    05/08/19 1623  NA 138  K 3.5  CL 104  CO2 21*  BUN 20  CREATININE 0.91  GLUCOSE 112*  CALCIUM 8.7*   No results for input(s): LABPT, INR in the last 72 hours.  Left lower leg: Sensation intact distally Incision: dressing C/D/I Compartment soft   Assessment/Plan: 1 Day Post-Op Procedure(s) (LRB): EXTERNAL FIXATION LEG (Left) Up with therapy  Needs CT of left ankle and disc of ankle CT.  If pain under control and does well with PT discharge to home.May need to stay additional day to work on pain control and work with PT.     Jason Russo 05/09/2019, 9:23 AM

## 2019-05-09 NOTE — Discharge Summary (Signed)
Patient ID: Jason CeriseJoseph Russo MRN: [id number] DOB/AGE: 1975-08-22 43 y.o.  Admit date: 05/08/2019 Discharge date: 05/09/2019  Admission Diagnoses:  Active Problems:   Closed left pilon fracture, initial encounter   Discharge Diagnoses:  Same  Past Medical History:  Diagnosis Date  . Hypertension     Surgeries: Procedure(s): EXTERNAL FIXATION LEG on 05/08/2019   Consultants: Treatment Team:  Kathryne HitchBlackman, Christopher Y, MD  Discharged Condition: Improved  Hospital Course: Jason CeriseJoseph Russo is an 43 y.o. male who was admitted 05/08/2019 for operative treatment of<principal problem not specified>. Patient has severe unremitting pain that affects sleep, daily activities, and work/hobbies. After pre-op clearance the patient was taken to the operating room on 05/08/2019 and underwent  Procedure(s): EXTERNAL FIXATION LEG.    Patient was given perioperative antibiotics:  Anti-infectives (From admission, onward)   Start     Dose/Rate Route Frequency Ordered Stop   05/08/19 1815  clindamycin (CLEOCIN) 900 MG/50ML IVPB    Note to Pharmacy: Anastasio ChampionEvans, Janet   : cabinet override      05/08/19 1815 05/08/19 1955       Patient was given sequential compression devices, early ambulation, and chemoprophylaxis to prevent DVT.  Patient benefited maximally from hospital stay and there were no complications.    Recent vital signs:  Patient Vitals for the past 24 hrs:  BP Temp Temp src Pulse Resp SpO2 Height Weight  05/09/19 0542 (!) 141/98 98 F (36.7 C) Oral 71 18 95 % - -  05/09/19 0200 (!) 155/86 98.3 F (36.8 C) Oral 67 18 96 % - -  05/09/19 0047 (!) 170/93 97.8 F (36.6 C) Oral 88 18 98 % - -  05/08/19 2349 (!) 166/99 97.8 F (36.6 C) Oral 79 18 97 % - -  05/08/19 2300 - - - - - - 5\' 10"  (1.778 m) 129.3 kg  05/08/19 2253 (!) 166/91 98.3 F (36.8 C) Oral 74 20 95 % - -  05/08/19 2230 (!) 168/102 98.3 F (36.8 C) - (!) 59 - 96 % - -  05/08/19 2215 (!) 166/102 - - 83 18 97 % - -  05/08/19  2200 (!) 181/108 - - 61 16 94 % - -  05/08/19 2145 (!) 168/106 - - 75 12 93 % - -  05/08/19 2137 (!) 179/117 - - 73 18 97 % - -  05/08/19 2135 (!) 196/114 - - 76 15 97 % - -  05/08/19 2130 - - - 81 (!) 21 100 % - -  05/08/19 2123 (!) 162/94 - - 78 (!) 22 99 % - -  05/08/19 2115 - - - 83 18 100 % - -  05/08/19 2105 - - - 74 11 (!) 88 % - -  05/08/19 2100 (!) 175/99 - - 81 13 97 % - -  05/08/19 2047 (!) 175/99 98.3 F (36.8 C) - 79 (!) 22 100 % - -  05/08/19 1425 (!) 172/109 97.6 F (36.4 C) Oral - 20 100 % - -     Recent laboratory studies:  Recent Labs    05/08/19 1623  WBC 12.2*  HGB 16.5  HCT 47.2  PLT 204  NA 138  K 3.5  CL 104  CO2 21*  BUN 20  CREATININE 0.91  GLUCOSE 112*  CALCIUM 8.7*     Discharge Medications:   Allergies as of 05/09/2019      Reactions   Hydromorphone Anaphylaxis   Cefdinir    Other reaction(s): Other, Other (See Comments) Other Reaction:  ARTHRALGIA Other Reaction: ARTHRALGIA   Dilaudid [hydromorphone Hcl] Other (See Comments)   Low HR   Sertraline Hcl    Other reaction(s): Other Suicidal thoughts      Medication List    STOP taking these medications   ibuprofen 200 MG tablet Commonly known as: ADVIL     TAKE these medications   aspirin EC 325 MG tablet Take 1 tablet (325 mg total) by mouth 2 (two) times daily.   methocarbamol 500 MG tablet Commonly known as: ROBAXIN Take 1 tablet (500 mg total) by mouth every 6 (six) hours as needed for muscle spasms.   oxyCODONE 5 MG immediate release tablet Commonly known as: Oxy IR/ROXICODONE Take 1-2 tablets (5-10 mg total) by mouth every 4 (four) hours as needed for moderate pain (pain score 4-6).   temazepam 7.5 MG capsule Commonly known as: Restoril Take 1 capsule (7.5 mg total) by mouth at bedtime as needed for sleep.            Durable Medical Equipment  (From admission, onward)         Start     Ordered   05/08/19 2301  DME Walker rolling  Once    Question:   Patient needs a walker to treat with the following condition  Answer:  Closed left pilon fracture, initial encounter   05/08/19 2300   05/08/19 2301  DME 3 n 1  Once     05/08/19 2300          Diagnostic Studies: Dg Tibia/fibula Left  Result Date: 05/08/2019 CLINICAL DATA:  Intraop reduction and fixation ankle fracture EXAM: LEFT TIBIA AND FIBULA - 2 VIEW COMPARISON:  None. FINDINGS: Three intraop fluoroscopic guided images of the medial malleolar fracture seen. Fluoro time 38 seconds IMPRESSION: Intraop views of reduction and external fixation medial malleolar fracture. Electronically Signed   By: Prudencio Pair M.D.   On: 05/08/2019 21:05   Dg Tibia/fibula Left  Result Date: 05/08/2019 CLINICAL DATA:  Fall. Left ankle and foot pain and swelling. EXAM: LEFT TIBIA AND FIBULA - 2 VIEW COMPARISON:  None. FINDINGS: There is a comminuted, mildly displaced fracture of the distal tibia extending from the distal tibia shaft to the tibial plafond and through the base of the medial malleolus. No fracture is identified involving the more proximal tibia or fibula. The knee is located. There is diffuse soft tissue swelling about the ankle. IMPRESSION: Comminuted, intra-articular distal tibia fracture. Electronically Signed   By: Logan Bores M.D.   On: 05/08/2019 15:41   Dg Ankle Complete Left  Result Date: 05/08/2019 CLINICAL DATA:  Fall. Left ankle and foot pain and swelling. EXAM: LEFT ANKLE COMPLETE - 3+ VIEW COMPARISON:  None. FINDINGS: There is a comminuted, mildly displaced fracture of the distal tibia extending from the distal tibia shaft to the tibial plafond and through the base of the medial malleolus. There is widening of the ankle joint laterally. The distal fibula appears intact. There is diffuse soft tissue swelling about the ankle. IMPRESSION: Comminuted, intra-articular distal tibia fracture with widening of the ankle joint. Electronically Signed   By: Logan Bores M.D.   On: 05/08/2019 15:40    Dg Foot Complete Left  Result Date: 05/08/2019 CLINICAL DATA:  Fall. Left ankle and foot pain and swelling. EXAM: LEFT FOOT - COMPLETE 3+ VIEW COMPARISON:  None. FINDINGS: No acute fracture or dislocation is identified in the foot. The distal tibia fracture is reported separately. There is a moderate-sized posterior calcaneal enthesophyte.  IMPRESSION: No acute osseous abnormality in the foot. Electronically Signed   By: Sebastian Ache M.D.   On: 05/08/2019 15:37   Dg C-arm 1-60 Min-no Report  Result Date: 05/08/2019 Fluoroscopy was utilized by the requesting physician.  No radiographic interpretation.    Disposition:     Follow-up Information    Orthopedic Surgeon. Schedule an appointment as soon as possible for a visit in 1 week(s).            SignedRichardean Canal 05/09/2019, 9:34 AM

## 2019-05-09 NOTE — TOC Initial Note (Signed)
Transition of Care [facility]) - Initial/Assessment Note    Patient Details  Name: Jason Russo MRN: [id number] Date of Birth: 12/12/1975  Transition of Care (TOC) CM/SW Contact:    Joaquin Courts, RN Phone Number: 05/09/2019, 10:47 AM  Clinical Narrative:                 Patient referred to [facility] for charity HHPT. [employer] to deliver rolling walker and 3-in-1 to bedside for home use.    Expected Discharge Plan: [place] Barriers to Discharge: No Barriers Identified   Patient Goals and CMS Choice Patient states their goals for this hospitalization and ongoing recovery are:: to go home      Expected Discharge Plan and Services Expected Discharge Plan: [place]   Discharge Planning Services: CM Consult   Living arrangements for the past 2 months: Single Family Home Expected Discharge Date: 05/09/19               DME Arranged: Berta Minor rolling DME Agency: [employer] Date DME Agency Contacted: 05/09/19 Time DME Agency Contacted: 98 Representative spoke with at DME Agency: [place]: PT [place]: [place] Date [place]: 05/09/19 Time [place]: 60 Representative spoke with at [place]: Tommi Rumps  Prior Living Arrangements/Services Living arrangements for the past 2 months: [place] Lives with:: Spouse Patient language and need for interpreter reviewed:: Yes Do you feel safe going back to the place where you live?: Yes      Need for Family Participation in Patient Care: Yes (Comment) Care giver support system in place?: Yes (comment)   Criminal Activity/Legal Involvement Pertinent to Current Situation/Hospitalization: No - Comment as needed  Activities of Daily Living Home Assistive Devices/Equipment: None ADL Screening (condition at time of admission) Patient's cognitive ability adequate to safely complete daily activities?: Yes Is the patient deaf or have difficulty  hearing?: No Does the patient have difficulty seeing, even when wearing glasses/contacts?: No Does the patient have difficulty concentrating, remembering, or making decisions?: No Patient able to express need for assistance with ADLs?: Yes Does the patient have difficulty dressing or bathing?: No Independently performs ADLs?: Yes (appropriate for developmental age) Does the patient have difficulty walking or climbing stairs?: No Weakness of Legs: None Weakness of Arms/Hands: None  Permission Sought/Granted                  Emotional Assessment Appearance:: Appears stated age Attitude/Demeanor/Rapport: Engaged Affect (typically observed): Accepting Orientation: : Oriented to Place, Oriented to  Time, Oriented to Situation, Oriented to Self   Psych Involvement: No (comment)  Admission diagnosis:  Closed displaced pilon fracture of left tibia, initial encounter [K34.917H] Patient Active Problem List   Diagnosis Date Noted  . Closed left pilon fracture, initial encounter 05/08/2019   PCP:  Royal Piedra, PA Pharmacy:   [facility] [address] [place] [address] Phone: [phone] Fax: [phone]  [facility] [place], [address] [place] [address] Phone: [phone] Fax: [phone]     Social Determinants of Health (SDOH) Interventions    Readmission Risk Interventions No flowsheet data found.

## 2019-05-09 NOTE — Discharge Instructions (Signed)
May remove the dressings early next week and get leg wet in shower.  Clean incisions daily with a Q-tip and hydrogen peroxide.  After showering try the leg well no need for application of new dressing. Nonweightbearing left leg.  Elevate leg above heart and wiggle toes often. Follow-up with orthopedic surgeon in your area within the next week take CT scan of ankle on disc with you to your appointment.

## 2019-05-10 NOTE — Progress Notes (Signed)
Physical Therapy Treatment Patient Details Name: Jason Russo MRN: [id number] DOB: Aug 12, 1976 Today's Date: 05/10/2019    History of Present Illness 43 YO male sustained left pilon fracture after stepping off of a flatbed truck. S/P external fixture 05/08/19.    PT Comments    Pt anxious and reluctant to accept input from PT. C/o severe pain despite oral meds and demanding IV meds from RN (gave during session).  Pt admits to hitting ex fix on floor repeatedly while amb to bathroom with spouse.  discussed the importance of full NWB LLE, had pt place shoe on RLE prior to amb and further educated on positioning LLE  and slowing his rate of movement to avoid WBing on distal ex fix during gait.  Much improvement in amb safety with shoe on RLE.  Do not recommend pt amb with crutches at this time, pt and wife in agreement.  Ready to d/c from PT standpoint  Follow Up Recommendations  Home health PT     Equipment Recommendations  Rolling walker with 5" wheels;Crutches;3in1 (PT)    Recommendations for Other Services       Precautions / Restrictions Precautions Precautions: Fall Restrictions Weight Bearing Restrictions: Yes LLE Weight Bearing: Non weight bearing    Mobility  Bed Mobility Overal bed mobility: Independent             General bed mobility comments: bed flat, no trapeze  Transfers Overall transfer level: Needs assistance Equipment used: Rolling walker (2 wheeled) Transfers: Sit to/from Stand Sit to Stand: Supervision         General transfer comment: cues for hand placement and to maintain safe positioning of LLE to avoid hitting ex fix on floor or RW  Ambulation/Gait Ambulation/Gait assistance: Supervision Gait Distance (Feet): 30 Feet Assistive device: Rolling walker (2 wheeled) Gait Pattern/deviations: Step-to pattern Gait velocity: decr   General Gait Details: cues for sequence, positioning of LLE to avoid hitting ex fix on floor or RW--pt admits  to hitting ex fix on floor repeatedly while amb to bathroom with spouse.  discussed the importance of full NWB LLE   Stairs Stairs: (pt states he will have ramp at home)           Information systems manager mobility: (his uncle is gettign him a w/c)  Modified Rankin (Stroke Patients Only)       Balance     Sitting balance-Leahy Scale: Normal     Standing balance support: Bilateral upper extremity supported;During functional activity;No upper extremity supported Standing balance-Leahy Scale: Fair(able to maintain static stand without UE support )                              Cognition Arousal/Alertness: Awake/alert Behavior During Therapy: WFL for tasks assessed/performed;Anxious Overall Cognitive Status: Within Functional Limits for tasks assessed                                        Exercises      General Comments        Pertinent Vitals/Pain Pain Assessment: 0-10 Pain Score: 7  Pain Location: LLE  Pain Descriptors / Indicators: Aching;Sharp Pain Intervention(s): Limited activity within patient's tolerance;Monitored during session;Repositioned;RN gave pain meds during session    Home Living  Prior Function            PT Goals (current goals can now be found in the care plan section) Acute Rehab PT Goals Patient Stated Goal: to go home PT Goal Formulation: With patient/family Time For Goal Achievement: 05/16/19 Potential to Achieve Goals: Good Progress towards PT goals: Progressing toward goals    Frequency    Min 6X/week      PT Plan Current plan remains appropriate    Co-evaluation              AM-PAC PT "6 Clicks" Mobility   Outcome Measure  Help needed turning from your back to your side while in a flat bed without using bedrails?: None Help needed moving from lying on your back to sitting on the side of a flat bed without using bedrails?:  None Help needed moving to and from a bed to a chair (including a wheelchair)?: A Little Help needed standing up from a chair using your arms (e.g., wheelchair or bedside chair)?: A Little Help needed to walk in hospital room?: A Little Help needed climbing 3-5 steps with a railing? : A Lot 6 Click Score: 19    End of Session Equipment Utilized During Treatment: Gait belt Activity Tolerance: Patient tolerated treatment well Patient left: in bed;with call bell/phone within reach;with family/visitor present Nurse Communication: Mobility status PT Visit Diagnosis: Unsteadiness on feet (R26.81);Pain Pain - Right/Left: Left Pain - part of body: Leg     Time: 1012-1040 PT Time Calculation (min) (ACUTE ONLY): 28 min  Charges:  $Gait Training: 8-22 mins $Therapeutic Activity: 8-22 mins                     Drucilla Chaletara Derius Ghosh, PT  Pager: [phone] Acute Rehab Dept [facility]): [phone]   05/10/2019    [facility] PcWILLIAMS,Jason Russo 05/10/2019, 10:50 AM

## 2019-05-10 NOTE — Progress Notes (Signed)
Subjective: 2 Days Post-Op Procedure(s) (LRB): EXTERNAL FIXATION LEG (Left) Patient reports pain as moderate.    Objective: Vital signs in last 24 hours: Temp:  [98 F (36.7 C)-98.4 F (36.9 C)] 98.4 F (36.9 C) (09/20 0624) Pulse Rate:  [54-66] 66 (09/20 0624) Resp:  [16-18] 18 (09/20 0624) BP: (150-179)/(82-97) 158/97 (09/20 0624) SpO2:  [95 %-97 %] 97 % (09/20 0624)  Intake/Output from previous day: 09/19 0701 - 09/20 0700 In: 300 [I.V.:300] Out: 1100 [Urine:1100] Intake/Output this shift: Total I/O In: 120 [P.O.:120] Out: 200 [Urine:200]  Recent Labs    05/08/19 1623  HGB 16.5   Recent Labs    05/08/19 1623  WBC 12.2*  RBC 5.71  HCT 47.2  PLT 204   Recent Labs    05/08/19 1623  NA 138  K 3.5  CL 104  CO2 21*  BUN 20  CREATININE 0.91  GLUCOSE 112*  CALCIUM 8.7*   No results for input(s): LABPT, INR in the last 72 hours.  Incision: dressing C/D/I Compartment soft   Assessment/Plan: 2 Days Post-Op Procedure(s) (LRB): EXTERNAL FIXATION LEG (Left) Discharge home with home health       Erskine Emery 05/10/2019, 12:25 PM

## 2019-05-10 NOTE — Progress Notes (Signed)
Nurse reviewed discharge instructions with pt.  Pt verbalized understanding of discharge instructions, follow up appointments and new medications.  No concerns at time of discharge. 

## 2019-05-11 ENCOUNTER — Encounter (HOSPITAL_COMMUNITY): Payer: Self-pay | Admitting: Orthopaedic Surgery

## 2019-05-14 NOTE — Anesthesia Postprocedure Evaluation (Signed)
Anesthesia Post Note  Patient: Jason Russo  Procedure(s) Performed: EXTERNAL FIXATION LEG (Left Leg Lower)     Patient location during evaluation: PACU Anesthesia Type: General Level of consciousness: awake and alert Pain management: pain level controlled Vital Signs Assessment: post-procedure vital signs reviewed and stable Respiratory status: spontaneous breathing, nonlabored ventilation, respiratory function stable and patient connected to nasal cannula oxygen Cardiovascular status: blood pressure returned to baseline and stable Postop Assessment: no apparent nausea or vomiting Anesthetic complications: no    Last Vitals:  Vitals:   05/09/19 2122 05/10/19 0624  BP: (!) 179/90 (!) 158/97  Pulse: 63 66  Resp: 18 18  Temp: 36.8 C 36.9 C  SpO2: 95% 97%    Last Pain:  Vitals:   05/10/19 1315  TempSrc:   PainSc: 9                  Alexya Mcdaris COKER

## 2019-05-14 NOTE — Anesthesia Preprocedure Evaluation (Signed)
Anesthesia Evaluation  Patient identified by MRN, date of birth, ID band Patient awake    Reviewed: Allergy & Precautions, NPO status , Patient's Chart, lab work & pertinent test results  Airway Mallampati: III  TM Distance: >3 FB Neck ROM: Full    Dental  (+) Poor Dentition, Teeth Intact, Dental Advisory Given   Pulmonary    breath sounds clear to auscultation       Cardiovascular hypertension,  Rhythm:Regular Rate:Normal     Neuro/Psych    GI/Hepatic   Endo/Other    Renal/GU      Musculoskeletal   Abdominal   Peds  Hematology   Anesthesia Other Findings   Reproductive/Obstetrics                             Anesthesia Physical Anesthesia Plan  ASA: III  Anesthesia Plan: General   Post-op Pain Management:    Induction: Intravenous  PONV Risk Score and Plan: Ondansetron and Dexamethasone  Airway Management Planned: Oral ETT  Additional Equipment:   Intra-op Plan:   Post-operative Plan: Extubation in OR  Informed Consent: I have reviewed the patients History and Physical, chart, labs and discussed the procedure including the risks, benefits and alternatives for the proposed anesthesia with the patient or authorized representative who has indicated his/her understanding and acceptance.     Dental advisory given  Plan Discussed with: CRNA and Anesthesiologist  Anesthesia Plan Comments:         Anesthesia Quick Evaluation

## 2020-04-22 IMAGING — CT CT ANKLE*L* W/O CM
3 series · 11 of 33 positions shown, 13 images · non-contrast
Comparison: None.

CLINICAL DATA: Ankle fracture presents for preoperative planning.

EXAM:
CT OF THE LEFT ANKLE WITHOUT CONTRAST
TECHNIQUE: Multidetector CT imaging of the left ankle was performed according
to the standard protocol. Multiplanar CT image reconstructions were
also generated.

[Series 9: axial st · axial · 0.45mm/px · z∈[-316,-94]mm · 3 of 241 slices shown, 4 images]
[im 56/241  soft-tissue]
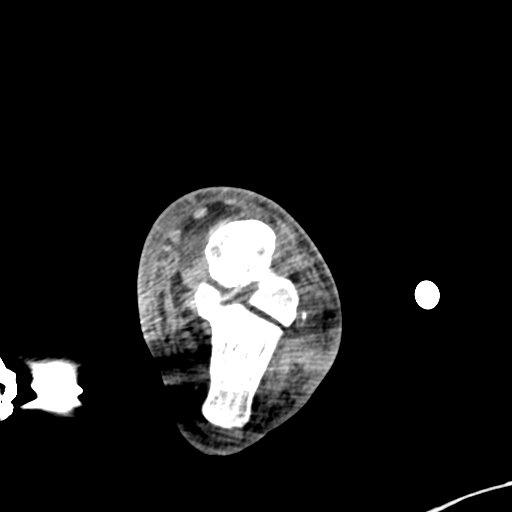
[im 56/241  bone]
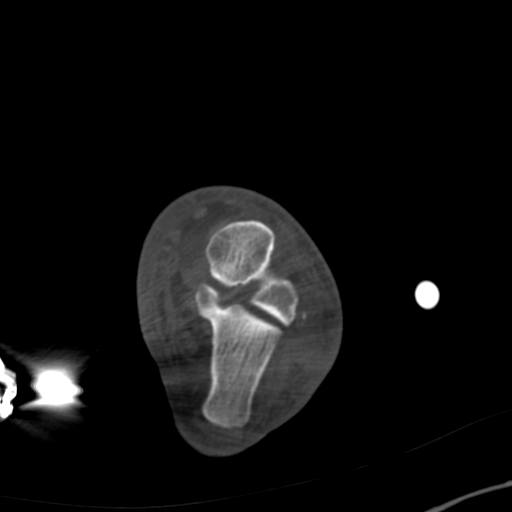
[im 130/241  bone]
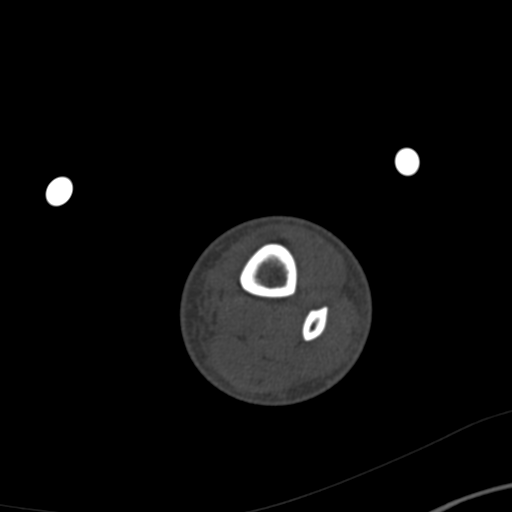
[im 204/241  bone]
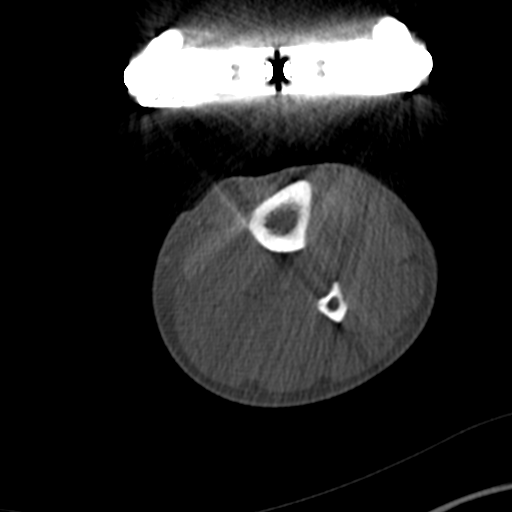

[Series 10: coronal st · coronal · 0.42mm/px · 3 of 119 slices shown]
[im 24/119  bone]
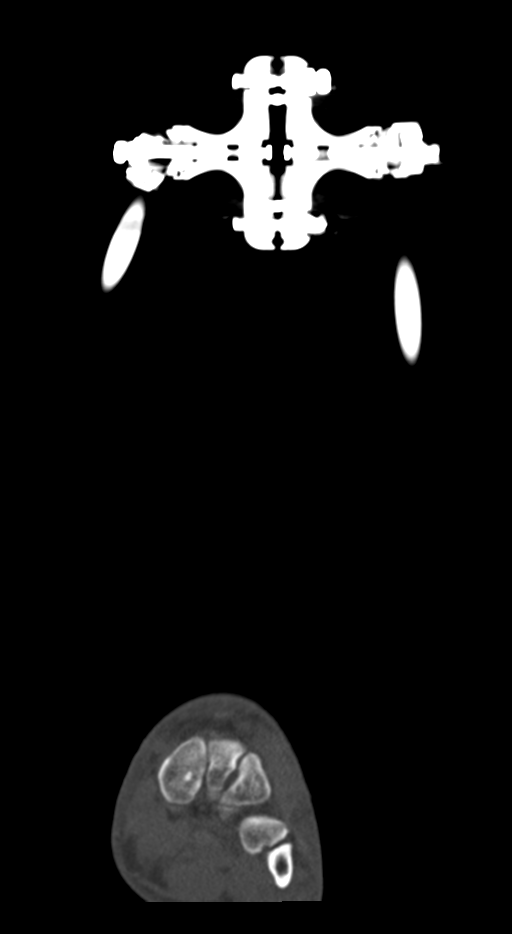
[im 48/119  bone]
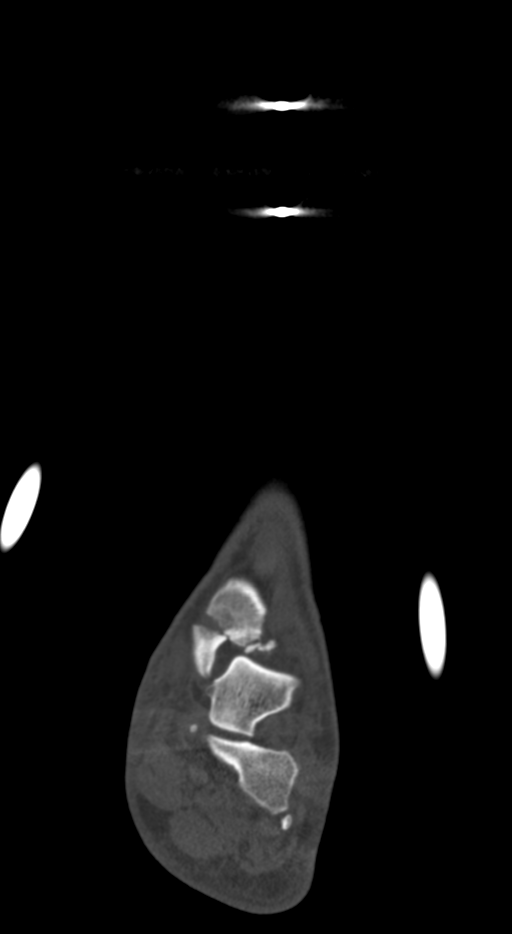
[im 71/119  bone]
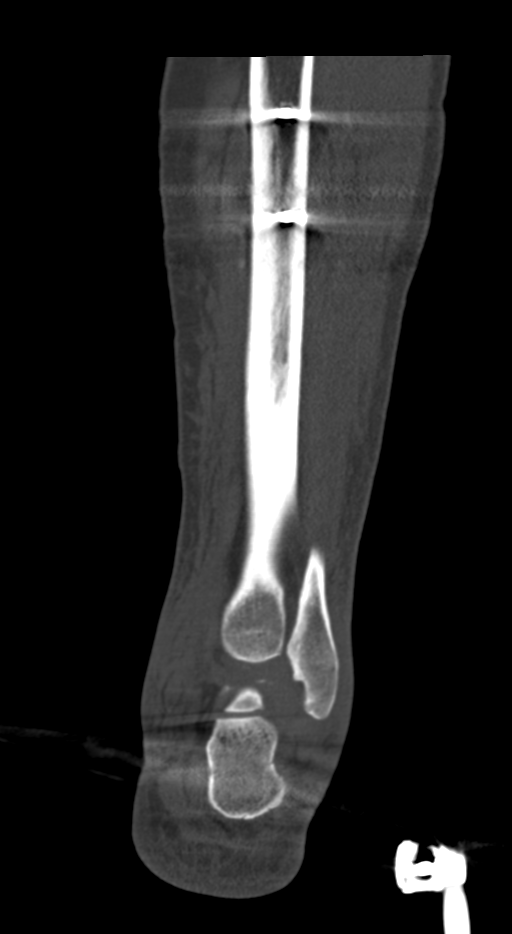

[Series 11: sagittal st · sagittal · 0.52mm/px · 5 of 101 slices shown, 6 images]
[im 34/101  bone]
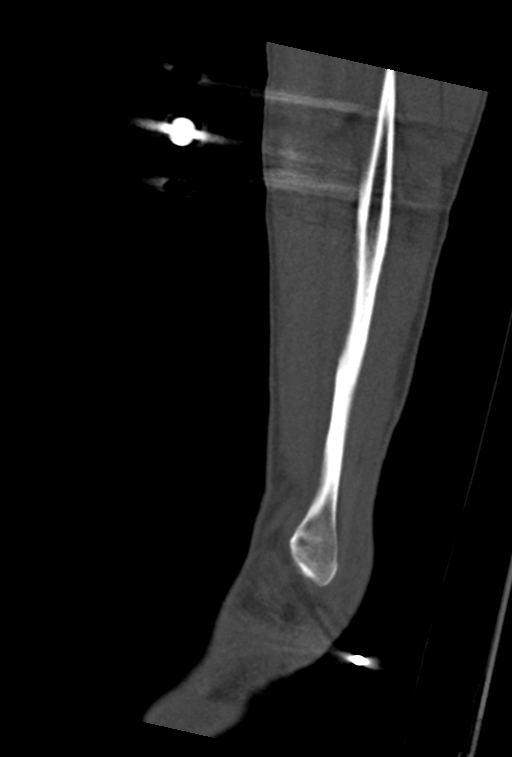
[im 42/101  bone]
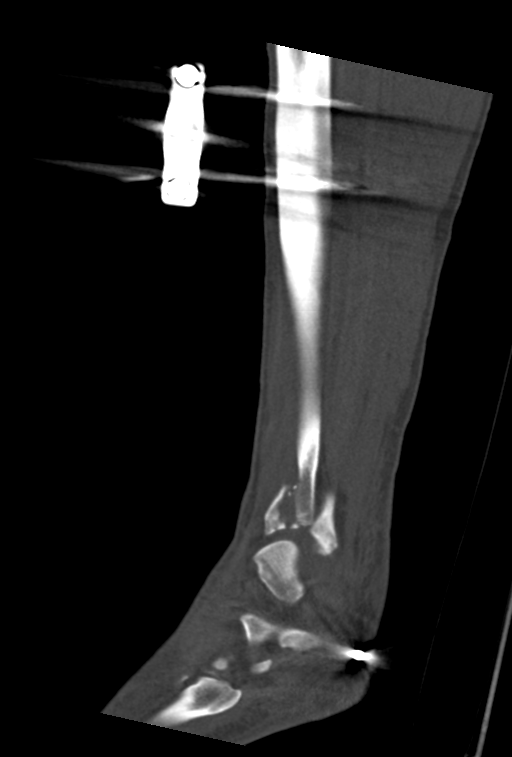
[im 51/101  soft-tissue]
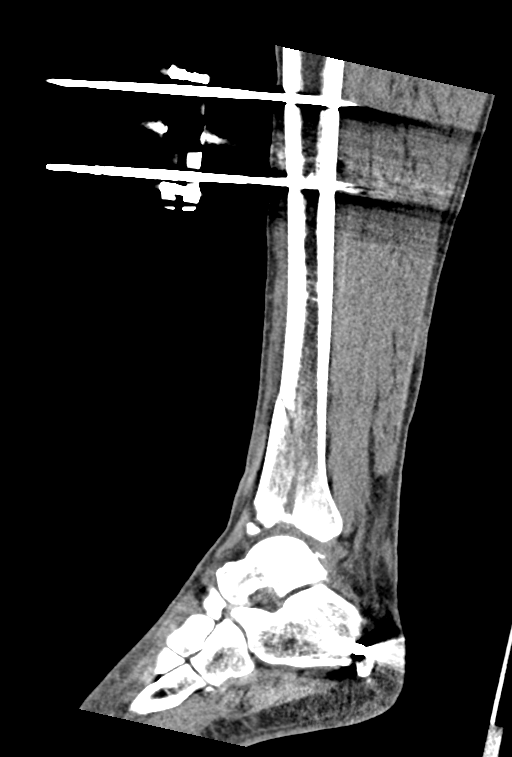
[im 51/101  bone]
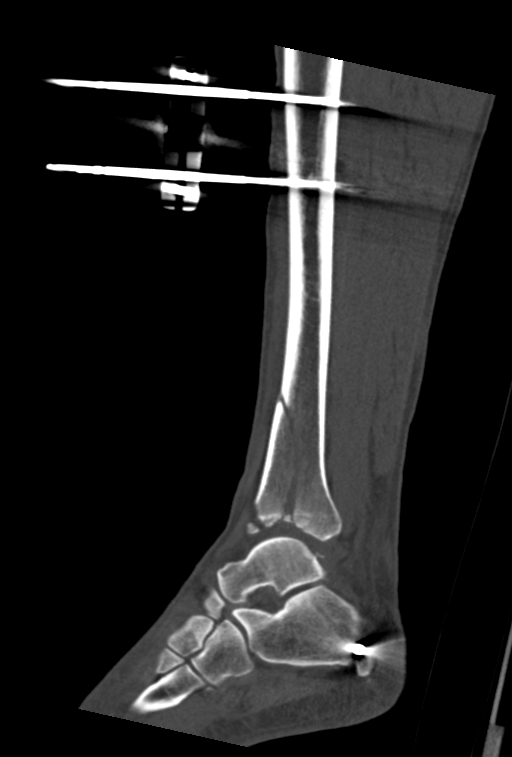
[im 59/101  bone]
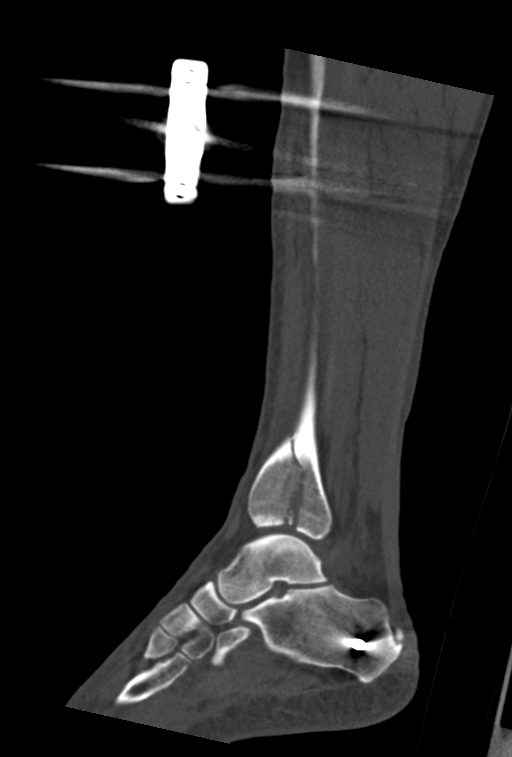
[im 67/101  bone]
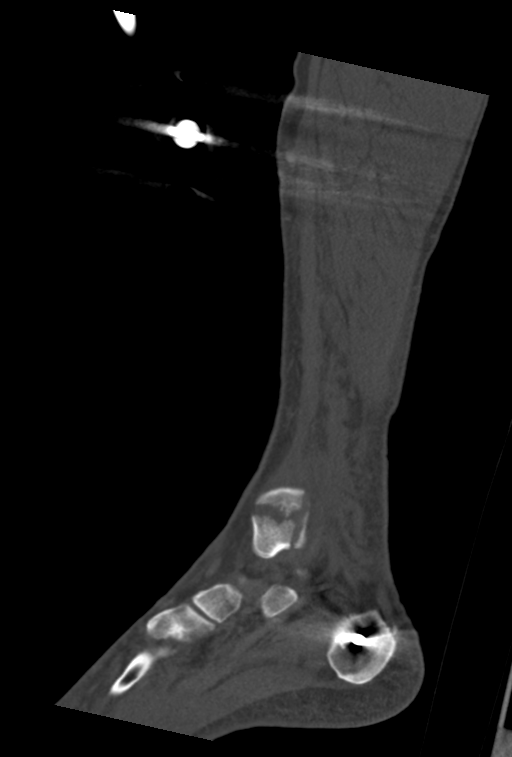

[11 of 33 positions shown; findings below may reference images not displayed]

FINDINGS: Bones/Joint/Cartilage

Nondisplaced medial malleolar fracture with 6 mm of distraction.
Comminuted fracture of the distal tibial diametaphysis extending to
the articular surface of the tibial plafond with 3 mm of depression
involving a 8 x 24 mm area of the articular surface along the
lateral aspect. Mildly comminuted fracture with multiple fracture
fragments along the anterior lip of the distal tibial epiphysis.

Small sliver of bone along the periphery of the posterolateral talar
dome most consistent with a nondisplaced fracture.

Small avulsion fracture of the distal tip of the lateral malleolus.

No other acute fracture or dislocation. No dislocation. Small ankle
joint effusion. No aggressive osseous lesion. No periosteal reaction
or bone destruction.

External fixator device with the 2 proximal rods traversing the rod
traversing the posterior calcaneus without failure or complication.
Mid tibial diaphysis and the distal

Ligaments

Ligaments are suboptimally evaluated by CT.

Muscles and Tendons
Muscles are normal. No muscle atrophy. No intramuscular fluid
collection or hematoma. Flexor, extensor, peroneal and Achilles
tendons are grossly intact. Posterior tibial tendon appears to be
partially insinuated between the fracture fragments of the distal
tibial metaphysis at fracture site of the the medial malleolus which
may be entrapped.

Soft tissue
No fluid collection or hematoma.  No soft tissue mass.
IMPRESSION: 1. Nondisplaced medial malleolar fracture with 6 mm of distraction.
Comminuted fracture of the distal tibial diametaphysis extending to
the articular surface of the tibial plafond with 3 mm of depression
involving a 8 x 24 mm area of the articular surface along the
lateral aspect. Mildly comminuted fracture with multiple fracture
fragments along the anterior lip of the distal tibial epiphysis.
2. Small sliver of bone along the periphery of the posterolateral
talar dome most consistent with a nondisplaced fracture.
3. Small avulsion fracture of the distal tip of the lateral
malleolus.
4. Possible entrapment of the posterior tibial tendon which appears
appears to be partially insinuated between the fracture fragments of
the distal tibial metaphysis at fracture site of the the medial
malleolus.
